# Patient Record
Sex: Female | Born: 2000 | Race: White | Hispanic: No | Marital: Single | Smoking: Never smoker
Health system: Southern US, Community
[De-identification: ages and names within clinical notes are randomized; demographics above are authoritative.]

---
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
## 2022-06-20 IMAGING — DX DG ANKLE COMPLETE 3+V*L*
3 series · 3 of 3 positions shown · non-contrast
Comparison: None.

CLINICAL DATA: Pain

EXAM:
LEFT ANKLE COMPLETE - 3+ VIEW

[ankle ap]
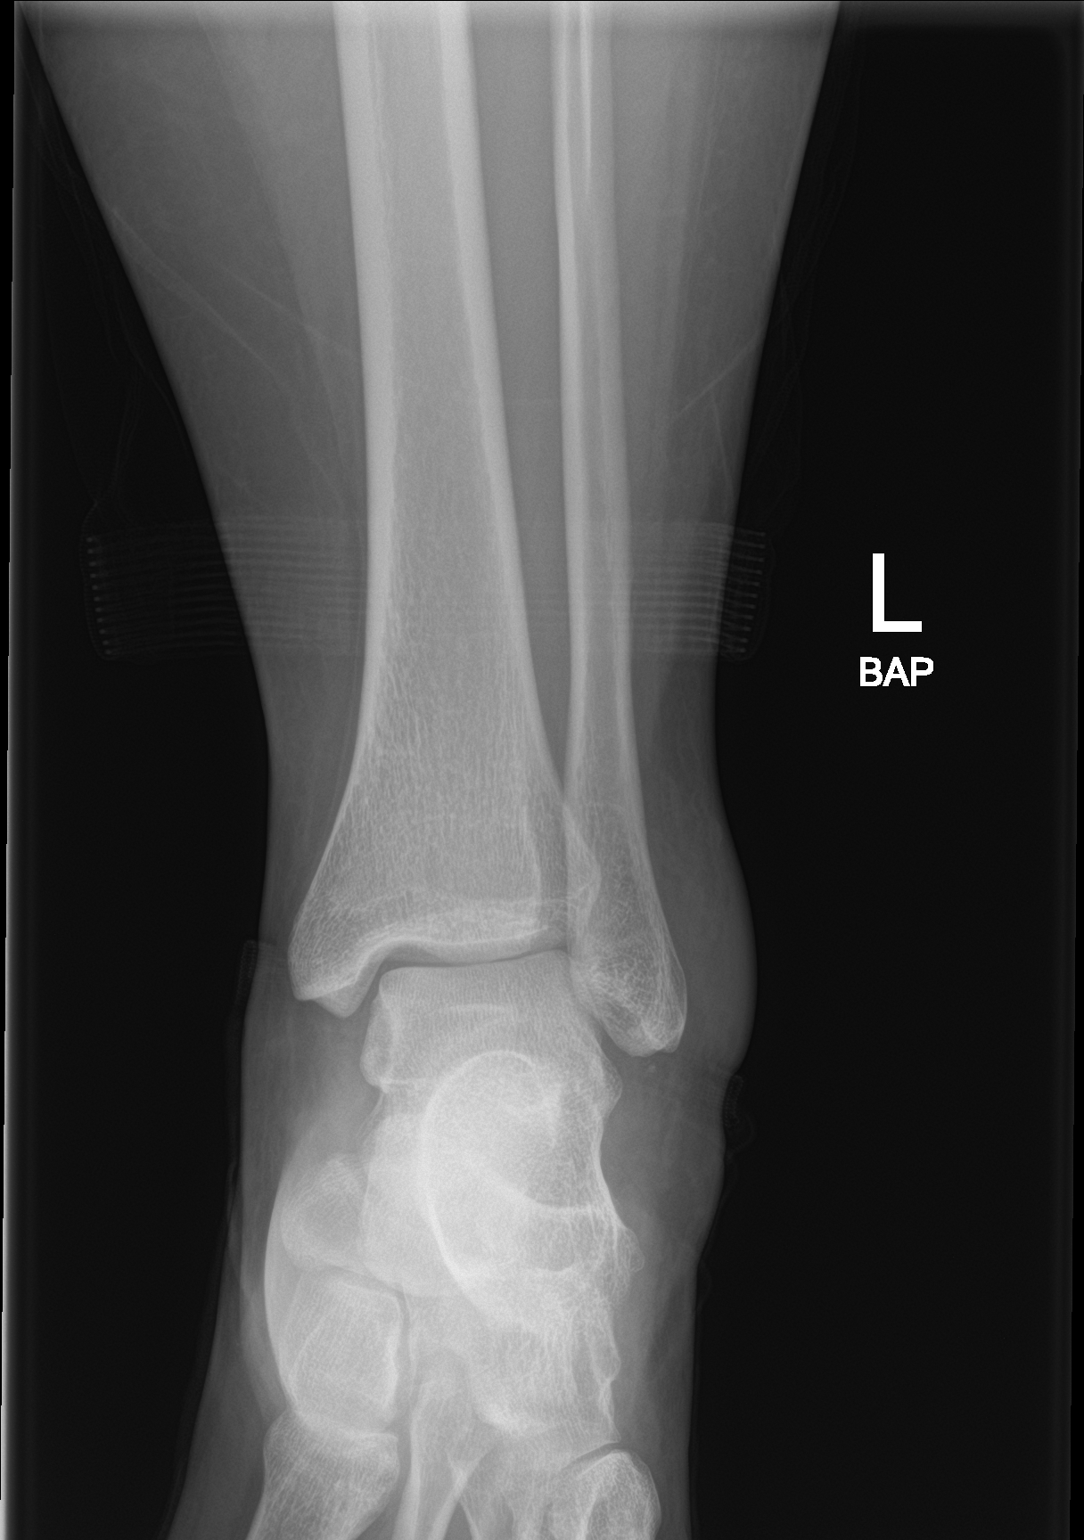

[ankle obl]
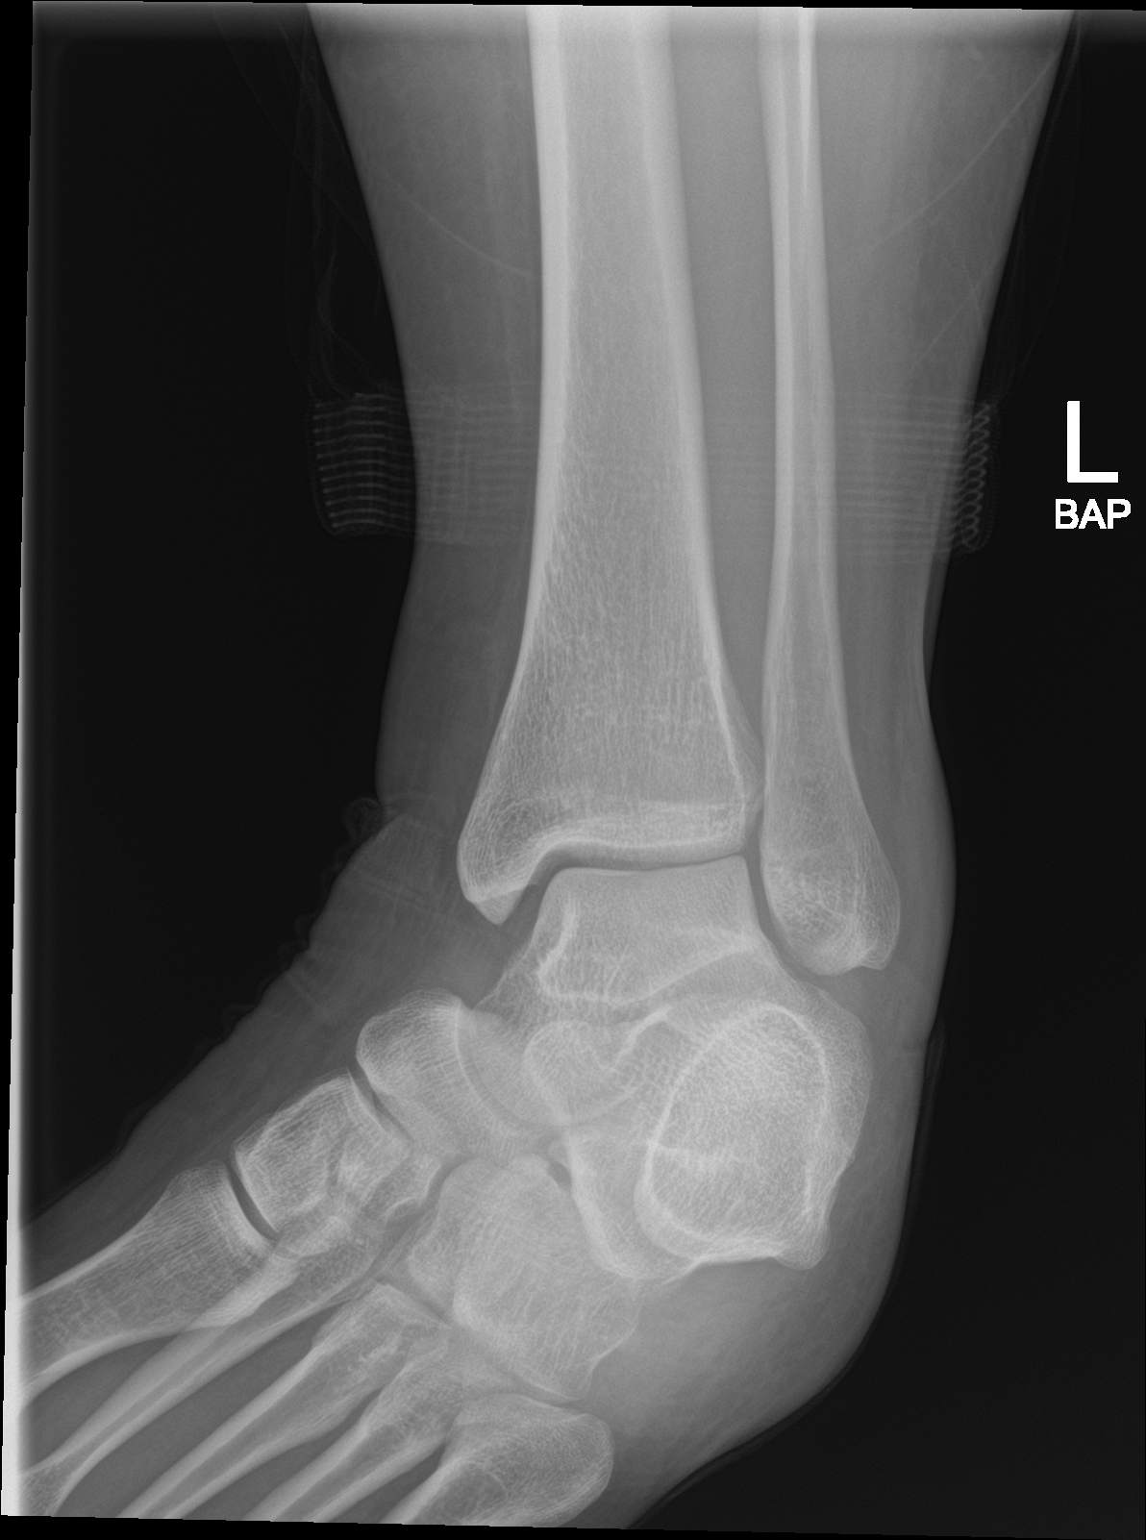

[ankle lat]
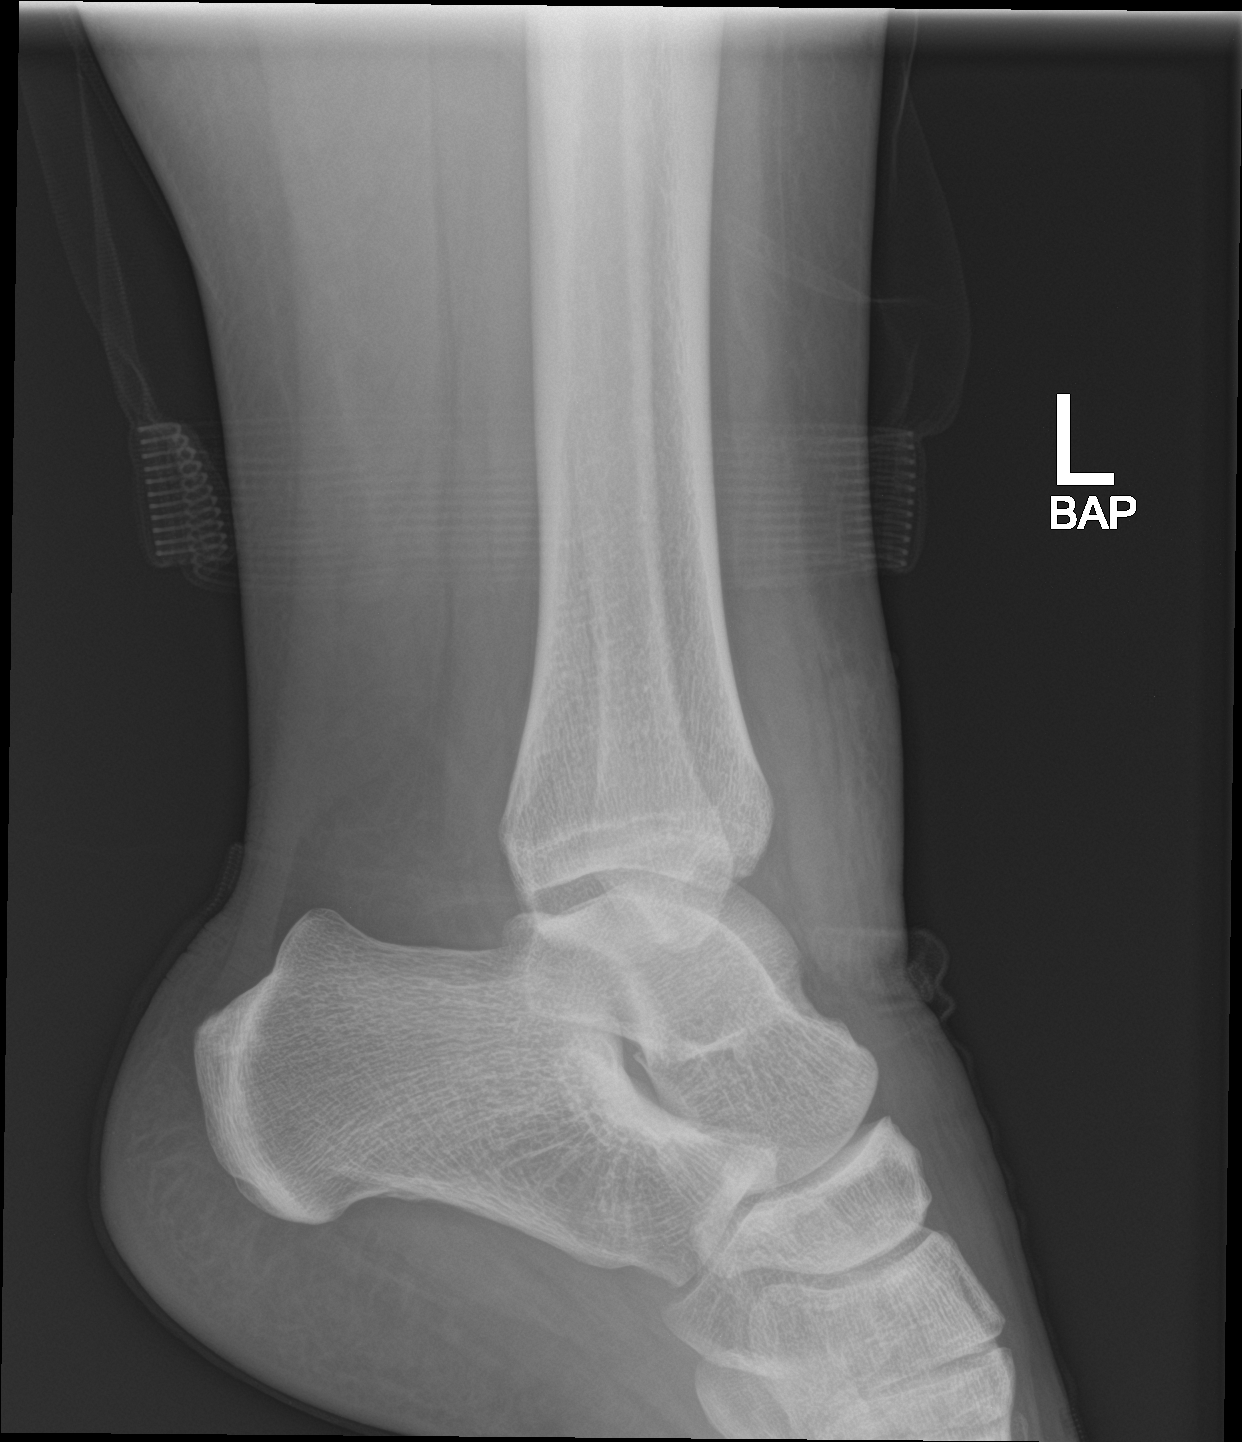

[3 of 3 positions shown; findings below may reference images not displayed]

FINDINGS: There is soft tissue swelling about the lateral malleolus with a
probable small avulsion fracture arising from the distal lateral
malleolus. There is no dislocation.
IMPRESSION: Probable small avulsion fracture arising from the distal lateral
malleolus with associated soft tissue swelling.
